# Patient Record
Sex: Female | Born: 1965 | Race: Black or African American | Hispanic: No | Marital: Married | Smoking: Never smoker
Health system: Southern US, Community
[De-identification: ages and names within clinical notes are randomized; demographics above are authoritative.]

---

## 1998-09-19 ENCOUNTER — Other Ambulatory Visit: Admission: RE | Admit: 1998-09-19 | Discharge: 1998-09-19 | Payer: Self-pay | Admitting: Obstetrics and Gynecology

## 1999-01-30 ENCOUNTER — Inpatient Hospital Stay (HOSPITAL_COMMUNITY): Admission: AD | Admit: 1999-01-30 | Discharge: 1999-01-30 | Payer: Self-pay | Admitting: Obstetrics and Gynecology

## 1999-02-25 ENCOUNTER — Inpatient Hospital Stay (HOSPITAL_COMMUNITY): Admission: AD | Admit: 1999-02-25 | Discharge: 1999-02-27 | Payer: Self-pay | Admitting: Obstetrics & Gynecology

## 1999-02-27 ENCOUNTER — Encounter: Payer: Self-pay | Admitting: Obstetrics & Gynecology

## 1999-04-05 ENCOUNTER — Inpatient Hospital Stay (HOSPITAL_COMMUNITY): Admission: AD | Admit: 1999-04-05 | Discharge: 1999-04-08 | Payer: Self-pay | Admitting: *Deleted

## 1999-04-05 ENCOUNTER — Encounter (INDEPENDENT_AMBULATORY_CARE_PROVIDER_SITE_OTHER): Payer: Self-pay

## 1999-04-12 ENCOUNTER — Encounter (HOSPITAL_COMMUNITY): Admission: RE | Admit: 1999-04-12 | Discharge: 1999-07-11 | Payer: Self-pay | Admitting: Obstetrics and Gynecology

## 1999-05-28 ENCOUNTER — Other Ambulatory Visit: Admission: RE | Admit: 1999-05-28 | Discharge: 1999-05-28 | Payer: Self-pay | Admitting: Obstetrics and Gynecology

## 2007-10-21 ENCOUNTER — Ambulatory Visit (HOSPITAL_COMMUNITY): Admission: RE | Admit: 2007-10-21 | Discharge: 2007-10-21 | Payer: Self-pay | Admitting: Obstetrics and Gynecology

## 2010-06-09 ENCOUNTER — Encounter: Payer: Self-pay | Admitting: Obstetrics and Gynecology

## 2010-10-04 NOTE — Op Note (Signed)
[facility]  Patient:    Alyssa Conley                         MRN: [id number] Proc. Date: 04/05/99 Adm. Date:  [id number] Attending:  Silverio Lay A                           Operative Report  PREOPERATIVE DIAGNOSIS:       Intrauterine pregnancy at 37 weeks 6 days, chronic hypertension, reduced fetal interval growth, and previous cesarean section.  POSTOPERATIVE DIAGNOSIS:      Intrauterine pregnancy at 37 weeks 6 days, chronic hypertension, reduced fetal interval growth, and previous cesarean section.  OPERATION:                    Repeat low transverse cesarean section.  SURGEON:                      Silverio Lay, M.D.  ASSISTANT:                    Hayes Ludwig, N.P.  ANESTHESIA:                   Spinal.  ESTIMATED BLOOD LOSS:         600 cc.  DESCRIPTION OF PROCEDURE:     After being informed of the procedure and consented, the patient was taken to cesarean suite and given spinal anesthesia, placed in dorsal decubitus pelvis tilted to the left, prepped and draped in the usual sterile fashion, and the Foley catheter was inserted into the bladder.  After assessing  adequate level of anesthesia, the skin, subcutaneous tissue, and fat were incised in a Pfannenstiel way overlooking the old scar.  The fascia was then incised in a low transverse fashion.  Linea alba was dissected and peritoneum was opened in he midline fashion.  We noted at this time that the lower uterine segment was very  thin and could even see the babys movement through the uterus.  The bladder was  very low.  The bladder flap was developed by opening the anterior peritoneum and bladder was retracted with a retractor.  The lower uterine segment was then entered in a low transverse fashion first with knife and then with scissors.  Amniotic fluid was greatly reduced, but was clear.  We assisted the birth of a female infant at 1:15 p.m. in a left OA presentation.   Mouth and nose were suctioned.  Cord was clamped with two Kelly clamps, sectioned, and the baby was given to the pediatrician present in the room.  20 cc were drawn through the umbilical vein.  The placenta was removed manually with complete cord.  Cord had three vessels.  Pitocin was then started to be noted the patient had received a dose of Ancef 2  grams prior to the procedure for Group B Strep positive.  Uterus was then closed in two layers first with a running locked suture of 0 Vicryl and then with a Lambert suture of 0 Vicryl covering the first one.  Hemostasis was then completed in the midline with two figure-of-eight sutures of 0 Vicryl.  Hemostasis of the uterine incision was then felt to be adequate.  Both pericolic gutters were cleansed. oth tubes and ovaries were assessed and normal.  Hemostasis was reassessed on the uterine incision and was normal.  Hemostasis was  then completed on the fascia with cautery.  The fascia was closed with two running sutures of 0 Vicryl meeting in the midline.  The subcutaneous tissue and fat were rinsed with saline.  Hemostasis as obtained with cautery.  The wound was infiltrated with Marcaine 0.25 10 cc and kin was closed with staples.  Estimated blood loss was 600 cc.  Sponge, needle, and  instrument counts were correct x 2.  The baby received Apgars of 9 at one minute and 9 at five minutes and weighed 5 pounds 8 ounces.  The procedure was very well tolerated by the patient who is taken to the recovery room in well and stable condition. DD:  04/05/99 TD:  04/08/99 Job: [id number]

## 2011-01-13 ENCOUNTER — Other Ambulatory Visit (HOSPITAL_COMMUNITY): Payer: Self-pay | Admitting: Obstetrics and Gynecology

## 2011-01-13 DIAGNOSIS — Z1231 Encounter for screening mammogram for malignant neoplasm of breast: Secondary | ICD-10-CM

## 2011-01-28 ENCOUNTER — Ambulatory Visit (HOSPITAL_COMMUNITY): Payer: BC Managed Care – PPO | Attending: Obstetrics and Gynecology

## 2011-03-05 ENCOUNTER — Ambulatory Visit (HOSPITAL_COMMUNITY)
Admission: RE | Admit: 2011-03-05 | Discharge: 2011-03-05 | Disposition: A | Discharge status: Home / Self Care | Payer: BC Managed Care – PPO | Source: Ambulatory Visit | Attending: Obstetrics and Gynecology | Admitting: Obstetrics and Gynecology

## 2011-03-05 DIAGNOSIS — Z1231 Encounter for screening mammogram for malignant neoplasm of breast: Secondary | ICD-10-CM | POA: Insufficient documentation

## 2012-10-12 ENCOUNTER — Other Ambulatory Visit (HOSPITAL_COMMUNITY): Payer: Self-pay | Admitting: Obstetrics and Gynecology

## 2012-10-12 DIAGNOSIS — Z1231 Encounter for screening mammogram for malignant neoplasm of breast: Secondary | ICD-10-CM

## 2012-10-19 ENCOUNTER — Ambulatory Visit (HOSPITAL_COMMUNITY)
Admission: RE | Admit: 2012-10-19 | Discharge: 2012-10-19 | Disposition: A | Discharge status: Home / Self Care | Payer: BC Managed Care – PPO | Source: Ambulatory Visit | Attending: Obstetrics and Gynecology | Admitting: Obstetrics and Gynecology

## 2012-10-19 DIAGNOSIS — Z1231 Encounter for screening mammogram for malignant neoplasm of breast: Secondary | ICD-10-CM | POA: Insufficient documentation

## 2012-12-10 ENCOUNTER — Encounter: Payer: Self-pay | Admitting: Dietician

## 2012-12-10 ENCOUNTER — Encounter: Payer: BC Managed Care – PPO | Attending: Obstetrics and Gynecology | Admitting: Dietician

## 2012-12-10 VITALS — Ht 68.5 in | Wt 280.8 lb

## 2012-12-10 DIAGNOSIS — R7303 Prediabetes: Secondary | ICD-10-CM

## 2012-12-10 DIAGNOSIS — E119 Type 2 diabetes mellitus without complications: Secondary | ICD-10-CM | POA: Insufficient documentation

## 2012-12-10 DIAGNOSIS — E669 Obesity, unspecified: Secondary | ICD-10-CM

## 2012-12-10 DIAGNOSIS — Z713 Dietary counseling and surveillance: Secondary | ICD-10-CM | POA: Insufficient documentation

## 2012-12-10 NOTE — Progress Notes (Signed)
Appt start time: 0800 end time:  0900.  Assessment:  Patient was seen on  12/10/12 for individual diabetes education. Pt also frustrated with inability to lose weight. Current HbA1c: 6.3 MEDICATIONS: see list.   DIETARY INTAKE: Usual eating pattern includes 2-3 meals and 1 snacks per day. Everyday foods include oatmeal, cereal.  Avoided foods include red meat, eggs.    24-hr recall:  B ( AM): oatmeal (regular oatmeal with 1 heaping TBSP brown sugar cooked in almond milk or water) or cereal (shredded wheat plain in almond milk) with a banana. water Snk ( AM): piece of fruit- usually a banana  L ( PM): oatmeal as above, or cereal, or deli meat with lettuce, cucumber. Water. almonds Snk ( PM): none D ( PM): baked chix with cucumbers and balsamic vinegar, and a pickle. Grilled chix and broccoli with brown rice or whole wheat pasta. Unsweetened tea.  Snk ( PM): none Beverages: water, plain tea, no sodas, no juice, almond milk only with cereal (original sweetened version), no coffee. EtOH none. Pt claims an inconsistent eating pattern, where she fairly often forgets to eat or decides not to eat a meal because she does not want to cook. Most days she will skip a meal. Subs for lunch a smoothie often times of just blended fruits and water. Usual physical activity: not regularly. Zumba (1 hour) and walking on treadmill (2 miles, 30 minutes) about once per week. Used to do more often.   Progress Towards Goal(s):  In progress.   Nutritional Diagnosis:  NI-5.8.2 Excessive carbohydrate intake As related to Pre-DM, frequent consumption of fruit, oatmeal, cereal throughout day.  As evidenced by pt diet recall, HgbA1c 6.3.    Intervention:  Nutrition counseling provided.  Discussed diabetes disease process and treatment options.  Discussed physiology of diabetes and role of obesity on insulin resistance.  Encouraged moderate weight reduction to improve glucose levels.  Discussed role of medications and  diet in glucose control  Provided education on macronutrients on glucose levels.  Provided education on carb counting, importance of regularly scheduled meals/snacks, and meal planning  Discussed effects of physical activity on glucose levels and long-term glucose control.  Recommended 150 minutes of physical activity/week.  Reviewed patient medications.  Discussed role of medication on blood glucose and possible side effects  Discussed blood glucose monitoring and interpretation.  Discussed recommended target ranges and individual ranges.    Described short-term complications: hyper- and hypo-glycemia.  Discussed causes,symptoms, and treatment options.  Discussed prevention, detection, and treatment of long-term complications.  Discussed the role of prolonged elevated glucose levels on body systems.  Discussed role of stress on blood glucose levels and discussed strategies to manage psychosocial issues.  Discussed recommendations for long-term diabetes self-care.  Established checklist for medical, dental, and emmotional self-care. Prescribed Meal pattern: RD reinforced the importance of eating on a more regular schedule, with regular protein intake. B- 3 CHO, 3 meat, 3 fat Snack- 1 CHO L- 3 CHO, 3 Meat, 3 fat D- 3 CHO, 3 Meat, 3 fat  Handouts given during visit include:  Diabetes basics  My food plan  Barriers to learning/adherance to lifestyle change: pt seems to have little desire to do much cooking, and has limited her protein choices drastically to only chicken, fish, and nuts. She also seems resistant to eating regularly for 3 meals each day.  Diabetes self-care support plan: RD has assigned a consistent CHO meal pattern, and pt seems agreeable and understands what needs to be done. She  was also encouraged to hear from RD that she should focus on identifying physical activities she enjoys, then pursue those for her minimum 150 minutes per week.  Monitoring/Evaluation:  Dietary  intake, exercise, HgbA1c, and body weight in 2 month(s).

## 2013-01-24 ENCOUNTER — Ambulatory Visit: Payer: BC Managed Care – PPO | Admitting: Dietician

## 2021-08-01 ENCOUNTER — Encounter (HOSPITAL_BASED_OUTPATIENT_CLINIC_OR_DEPARTMENT_OTHER): Payer: Self-pay

## 2021-08-01 ENCOUNTER — Emergency Department (HOSPITAL_BASED_OUTPATIENT_CLINIC_OR_DEPARTMENT_OTHER)
Admission: EM | Admit: 2021-08-01 | Discharge: 2021-08-01 | Disposition: A | Discharge status: Home / Self Care | Payer: 59 | Attending: Emergency Medicine | Admitting: Emergency Medicine

## 2021-08-01 ENCOUNTER — Emergency Department (HOSPITAL_BASED_OUTPATIENT_CLINIC_OR_DEPARTMENT_OTHER): Payer: 59

## 2021-08-01 ENCOUNTER — Other Ambulatory Visit: Payer: Self-pay

## 2021-08-01 DIAGNOSIS — S0083XA Contusion of other part of head, initial encounter: Secondary | ICD-10-CM | POA: Insufficient documentation

## 2021-08-01 DIAGNOSIS — Z79899 Other long term (current) drug therapy: Secondary | ICD-10-CM | POA: Diagnosis not present

## 2021-08-01 DIAGNOSIS — I10 Essential (primary) hypertension: Secondary | ICD-10-CM | POA: Diagnosis not present

## 2021-08-01 DIAGNOSIS — S060X0A Concussion without loss of consciousness, initial encounter: Secondary | ICD-10-CM | POA: Insufficient documentation

## 2021-08-01 DIAGNOSIS — H938X3 Other specified disorders of ear, bilateral: Secondary | ICD-10-CM | POA: Diagnosis not present

## 2021-08-01 DIAGNOSIS — Y92410 Unspecified street and highway as the place of occurrence of the external cause: Secondary | ICD-10-CM | POA: Diagnosis not present

## 2021-08-01 DIAGNOSIS — R262 Difficulty in walking, not elsewhere classified: Secondary | ICD-10-CM | POA: Diagnosis not present

## 2021-08-01 DIAGNOSIS — S0033XA Contusion of nose, initial encounter: Secondary | ICD-10-CM | POA: Diagnosis not present

## 2021-08-01 DIAGNOSIS — S0990XA Unspecified injury of head, initial encounter: Secondary | ICD-10-CM | POA: Diagnosis present
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
## 2021-08-01 NOTE — Discharge Instructions (Signed)
Make sure you get plenty of rest, avoid eyestrain.  If you continue to have headaches and symptoms on Monday then you should not return to work. ?
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
## 2023-08-03 ENCOUNTER — Ambulatory Visit: Admitting: Family Medicine
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
## 2023-11-17 ENCOUNTER — Other Ambulatory Visit: Payer: Self-pay | Admitting: Obstetrics and Gynecology

## 2023-11-17 DIAGNOSIS — Z1231 Encounter for screening mammogram for malignant neoplasm of breast: Secondary | ICD-10-CM

## 2024-01-22 ENCOUNTER — Encounter: Payer: Self-pay | Admitting: Obstetrics and Gynecology

## 2024-03-14 ENCOUNTER — Ambulatory Visit
Admission: RE | Admit: 2024-03-14 | Discharge: 2024-03-14 | Disposition: A | Discharge status: Home / Self Care | Source: Ambulatory Visit | Attending: Obstetrics and Gynecology | Admitting: Obstetrics and Gynecology

## 2024-03-14 DIAGNOSIS — Z1231 Encounter for screening mammogram for malignant neoplasm of breast: Secondary | ICD-10-CM
